# Patient Record
Sex: Female | Born: 1995 | Race: White | Hispanic: No | Marital: Single | Smoking: Never smoker
Health system: Southern US, Community
[De-identification: ages and names within clinical notes are randomized; demographics above are authoritative.]

## PROBLEM LIST (undated history)

## (undated) DIAGNOSIS — N83201 Unspecified ovarian cyst, right side: Secondary | ICD-10-CM

## (undated) DIAGNOSIS — E079 Disorder of thyroid, unspecified: Secondary | ICD-10-CM

## (undated) HISTORY — DX: Disorder of thyroid, unspecified: E07.9

## (undated) HISTORY — DX: Unspecified ovarian cyst, right side: N83.201

---

## 2016-03-06 ENCOUNTER — Emergency Department
Admission: EM | Admit: 2016-03-06 | Discharge: 2016-03-06 | Disposition: A | Discharge status: Home / Self Care | Payer: BLUE CROSS/BLUE SHIELD | Attending: Emergency Medicine | Admitting: Emergency Medicine

## 2016-03-06 ENCOUNTER — Emergency Department: Payer: BLUE CROSS/BLUE SHIELD

## 2016-03-06 ENCOUNTER — Encounter: Payer: Self-pay | Admitting: Medical Oncology

## 2016-03-06 DIAGNOSIS — R05 Cough: Secondary | ICD-10-CM | POA: Diagnosis present

## 2016-03-06 DIAGNOSIS — J322 Chronic ethmoidal sinusitis: Secondary | ICD-10-CM | POA: Insufficient documentation

## 2016-03-06 DIAGNOSIS — R059 Cough, unspecified: Secondary | ICD-10-CM

## 2016-03-06 MED ORDER — BENZONATATE 200 MG PO CAPS: 200.0000 mg | ORAL_CAPSULE | Freq: Three times a day (TID) | ORAL | 0 refills | Status: AC | PRN

## 2016-03-06 MED ORDER — FEXOFENADINE-PSEUDOEPHED ER 60-120 MG PO TB12: 1.0000 | ORAL_TABLET | Freq: Two times a day (BID) | ORAL | 0 refills | Status: AC

## 2016-03-06 NOTE — ED Provider Notes (Signed)
[facility] Emergency Department Provider Note   ____________________________________________   First MD Initiated Contact with Patient 03/06/16 1816     (approximate)  I have reviewed the triage vital signs and the nursing notes.   HISTORY  Chief Complaint Sore Throat; Generalized Body Aches; Cough; and Fever    HPI Dyesha Lawerance BachBurns is a 21 y.o. female patient complaining of sore throat and cough 2 weeks. Patient state that one week ago she developed fever and denies body aches. Patient states she had a negative flu test on that date. Patient continued to have cough. Denies nausea vomiting diarrhea. Patient has not taken a flu shot this season. Patient stated the cough intermitting productive and nonproductive. Patient also states there is some nasal congestion. Patient is using over-the-counter cough medicine with no relief.Patient rates her pain discomfort as 8/10. Patient is currently taking amoxicillin which was prescribed 5 days ago.   History reviewed. No pertinent past medical history.  There are no active problems to display for this patient.   No past surgical history on file.  Prior to Admission medications   Medication Sig Start Date End Date Taking? Authorizing Provider  amoxicillin (AMOXIL) 500 MG capsule Take 500 mg by mouth 3 (three) times daily.   Yes Historical Provider, MD  benzonatate (TESSALON) 200 MG capsule Take 1 capsule (200 mg total) by mouth 3 (three) times daily as needed for cough. 03/06/16   Joni Reiningonald K Smith, PA-C  fexofenadine-pseudoephedrine (ALLEGRA-D) 60-120 MG 12 hr tablet Take 1 tablet by mouth 2 (two) times daily. 03/06/16   Joni Reiningonald K Smith, PA-C    Allergies Guaifenesin & derivatives  No family history on file.  Social History Social History  Substance Use Topics  . Smoking status: Not on file  . Smokeless tobacco: Not on file  . Alcohol use Not on file    Review of Systems Constitutional: No fever/chills Eyes:  No visual changes. ENT: Sore throat and nasal congestion. Cardiovascular: Denies chest pain. Respiratory: Denies shortness of breath. Dr. cough. Gastrointestinal: No abdominal pain.  No nausea, no vomiting.  No diarrhea.  No constipation. Genitourinary: Negative for dysuria. Musculoskeletal: Negative for back pain. Skin: Negative for rash. Neurological: Negative for headaches, focal weakness or numbness.    ____________________________________________   PHYSICAL EXAM:  VITAL SIGNS: ED Triage Vitals  Enc Vitals Group     BP 03/06/16 1740 (!) 127/92     Pulse Rate 03/06/16 1739 99     Resp 03/06/16 1739 18     Temp 03/06/16 1739 97.9 F (36.6 C)     Temp Source 03/06/16 1739 Oral     SpO2 03/06/16 1739 98 %     Weight 03/06/16 1739 185 lb (83.9 kg)     Height 03/06/16 1739 5\' 5"  (1.651 m)     Head Circumference --      Peak Flow --      Pain Score 03/06/16 1739 8     Pain Loc --      Pain Edu? --      Excl. in GC? --     Constitutional: Alert and oriented. Well appearing and in no acute distress. Eyes: Conjunctivae are normal. PERRL. EOMI. Head: Atraumatic. Nose: Edematous nasal turbinates, thick nasal discharge and bilateral maxillary guarding. Mouth/Throat: Mucous membranes are moist.  Oropharynx non-erythematous. Postnasal drainage Neck: No stridor.  No cervical spine tenderness to palpation. Hematological/Lymphatic/Immunilogical: No cervical lymphadenopathy. Cardiovascular: Normal rate, regular rhythm. Grossly normal heart sounds.  Good peripheral circulation.  Respiratory: Normal respiratory effort.  No retractions. Lungs CTAB. Gastrointestinal: Soft and nontender. No distention. No abdominal bruits. No CVA tenderness. Musculoskeletal: No lower extremity tenderness nor edema.  No joint effusions. Neurologic:  Normal speech and language. No gross focal neurologic deficits are appreciated. No gait instability. Skin:  Skin is warm, dry and intact. No rash  noted. Psychiatric: Mood and affect are normal. Speech and behavior are normal.  ____________________________________________   LABS (all labs ordered are listed, but only abnormal results are displayed)  Labs Reviewed - No data to display ____________________________________________  EKG   ____________________________________________  RADIOLOGY  No acute findings on chest x-ray. ____________________________________________   PROCEDURES  Procedure(s) performed: None  Procedures  Critical Care performed: No  ____________________________________________   INITIAL IMPRESSION / ASSESSMENT AND PLAN / ED COURSE  Pertinent labs & imaging results that were available during my care of the patient were reviewed by me and considered in my medical decision making (see chart for details).  Maxillary sinusitis with postnasal drainage and cough. Patient advised to continue antibiotics as directed. Patient given a prescription for Allegra-D and Tessalon Perles. Patient advised to follow-up with family clinic if no improvement 3-5 days. Return by ER if condition worsens.  Clinical Course as of Mar 06 1900  Thu Mar 06, 2016  1837 DG Chest 2 View [RS]  1842 DG Chest 2 View [RS]  1843 DG Chest 2 View [RS]  1848 DG Chest 2 View [RS]  1849 DG Chest 2 View [RS]  1849 DG Chest 2 View [RS]  1849 DG Chest 2 View [RS]    Clinical Course User Index [RS] Valda Lamb, Student-PA     ____________________________________________   FINAL CLINICAL IMPRESSION(S) / ED DIAGNOSES  Final diagnoses:  Cough  Sinusitis chronic, ethmoidal      NEW MEDICATIONS STARTED DURING THIS VISIT:  New Prescriptions   BENZONATATE (TESSALON) 200 MG CAPSULE    Take 1 capsule (200 mg total) by mouth 3 (three) times daily as needed for cough.   FEXOFENADINE-PSEUDOEPHEDRINE (ALLEGRA-D) 60-120 MG 12 HR TABLET    Take 1 tablet by mouth 2 (two) times daily.     Note:  This document was prepared using  Dragon voice recognition software and may include unintentional dictation errors.    Joni Reining, PA-C 03/06/16 1902    Myrna Blazer, MD 03/06/16 2120

## 2016-03-06 NOTE — Discharge Instructions (Signed)
Continue antibiotics from previous prescriber.

## 2016-03-06 NOTE — ED Notes (Signed)
See triage note  States she developed sore throat and cough about 2 weeks ago  Last Thursday developed fever and body ache  Was seen at [facility] had the flu r/o  But conts to have cough   No fever at present

## 2016-03-06 NOTE — ED Triage Notes (Signed)
Pt reports flu like sx's that began over a week ago.

## 2017-01-20 HISTORY — PX: OVARIAN CYST REMOVAL: SHX89

## 2018-02-10 ENCOUNTER — Other Ambulatory Visit: Payer: Self-pay

## 2018-02-10 ENCOUNTER — Emergency Department
Admission: EM | Admit: 2018-02-10 | Discharge: 2018-02-10 | Disposition: A | Discharge status: Home / Self Care | Payer: BLUE CROSS/BLUE SHIELD | Attending: Student in an Organized Health Care Education/Training Program | Admitting: Student in an Organized Health Care Education/Training Program

## 2018-02-10 ENCOUNTER — Encounter: Payer: Self-pay | Admitting: *Deleted

## 2018-02-10 DIAGNOSIS — R197 Diarrhea, unspecified: Secondary | ICD-10-CM | POA: Insufficient documentation

## 2018-02-10 DIAGNOSIS — Z79899 Other long term (current) drug therapy: Secondary | ICD-10-CM | POA: Diagnosis not present

## 2018-02-10 DIAGNOSIS — R112 Nausea with vomiting, unspecified: Secondary | ICD-10-CM | POA: Insufficient documentation

## 2018-02-10 LAB — COMPREHENSIVE METABOLIC PANEL
ALK PHOS: 33 U/L — AB (ref 38–126)
ALT: 16 U/L (ref 0–44)
AST: 17 U/L (ref 15–41)
Albumin: 4.9 g/dL (ref 3.5–5.0)
Anion gap: 7 (ref 5–15)
BUN: 8 mg/dL (ref 6–20)
CALCIUM: 9.5 mg/dL (ref 8.9–10.3)
CO2: 23 mmol/L (ref 22–32)
Chloride: 110 mmol/L (ref 98–111)
Creatinine, Ser: 0.67 mg/dL (ref 0.44–1.00)
Glucose, Bld: 96 mg/dL (ref 70–99)
Potassium: 4 mmol/L (ref 3.5–5.1)
Sodium: 140 mmol/L (ref 135–145)
Total Bilirubin: 1 mg/dL (ref 0.3–1.2)
Total Protein: 7.8 g/dL (ref 6.5–8.1)

## 2018-02-10 LAB — URINALYSIS, COMPLETE (UACMP) WITH MICROSCOPIC
BILIRUBIN URINE: NEGATIVE
GLUCOSE, UA: NEGATIVE mg/dL
HGB URINE DIPSTICK: NEGATIVE
KETONES UR: NEGATIVE mg/dL
NITRITE: NEGATIVE
Protein, ur: NEGATIVE mg/dL
Specific Gravity, Urine: 1.018 (ref 1.005–1.030)
pH: 7 (ref 5.0–8.0)

## 2018-02-10 LAB — CBC
HCT: 41.4 % (ref 36.0–46.0)
Hemoglobin: 13.5 g/dL (ref 12.0–15.0)
MCH: 28.5 pg (ref 26.0–34.0)
MCHC: 32.6 g/dL (ref 30.0–36.0)
MCV: 87.5 fL (ref 80.0–100.0)
PLATELETS: 348 10*3/uL (ref 150–400)
RBC: 4.73 MIL/uL (ref 3.87–5.11)
RDW: 12.8 % (ref 11.5–15.5)
WBC: 4.5 10*3/uL (ref 4.0–10.5)
nRBC: 0 % (ref 0.0–0.2)

## 2018-02-10 LAB — LIPASE, BLOOD: Lipase: 28 U/L (ref 11–51)

## 2018-02-10 LAB — POC URINE PREG, ED: PREG TEST UR: NEGATIVE

## 2018-02-10 MED ORDER — METOCLOPRAMIDE HCL 5 MG/ML IJ SOLN
10.0000 mg | Freq: Once | INTRAMUSCULAR | Status: AC
Start: 2018-02-10 — End: 2018-02-10
  Administered 2018-02-10: 10 mg via INTRAVENOUS
  Filled 2018-02-10: qty 2

## 2018-02-10 MED ORDER — PROMETHAZINE HCL 25 MG/ML IJ SOLN
12.5000 mg | Freq: Four times a day (QID) | INTRAMUSCULAR | Status: DC | PRN
  Administered 2018-02-10: 12.5 mg via INTRAVENOUS
  Filled 2018-02-10: qty 1

## 2018-02-10 MED ORDER — METOCLOPRAMIDE HCL 10 MG PO TABS: 10.0000 mg | ORAL_TABLET | Freq: Four times a day (QID) | ORAL | 0 refills | Status: AC | PRN

## 2018-02-10 MED ORDER — SODIUM CHLORIDE 0.9 % IV BOLUS
1000.0000 mL | Freq: Once | INTRAVENOUS | Status: AC
  Administered 2018-02-10: 1000 mL via INTRAVENOUS

## 2018-02-10 NOTE — ED Triage Notes (Signed)
PT to ED reporting NVDS with dark stool and estimated 4 BMs a day. Generalized abd pain x 5 days. Pt unable to to keep food or drink down. Pt verbalized concern that she ate at a new restaurant and thought she could have had food poisoning but the pain has persisted. Pt unsure if she has had fevers but reports feeling clammy and chills with intermittent dizziness and fatigue. PT was given Zofran from [facility] center but reports it did not help.

## 2018-02-10 NOTE — ED Notes (Signed)
RN called to verify blood testing. Lab reports they are running the blood at this time.

## 2018-02-10 NOTE — ED Provider Notes (Signed)
[facility] Emergency Department Provider Note    First MD Initiated Contact with Patient 02/10/18 1414     (approximate)  I have reviewed the triage vital signs and the nursing notes.   HISTORY  Chief Complaint Abdominal Pain    HPI Anne Cobb is a 23 y.o. female presents the ER for evaluation of nausea vomiting and diarrhea that started over the weekend if the patient ate at [employer] restaurant in [place].  Is worried that she got food poisoning but the symptoms have not resolved.  Denies any focal pain.  States that she has noted a little bit of blood at the end of her vomiting.  Denies any chest pain or shortness of breath.  No bloody diarrhea.  States that she is having 3-4 loose stools per day and presented to the ER primarily because she felt dehydrated.  She [facility] yesterday and was given Zofran without improvement in her symptoms.  Denies any dysuria.  Did have ovarian cystectomy done over the summer but does not have any pain related to that.  History reviewed. No pertinent past medical history. History reviewed. No pertinent family history. History reviewed. No pertinent surgical history. There are no active problems to display for this patient.     Prior to Admission medications   Medication Sig Start Date End Date Taking? Authorizing Provider  amoxicillin (AMOXIL) 500 MG capsule Take 500 mg by mouth 3 (three) times daily.    [provider]  benzonatate (TESSALON) 200 MG capsule Take 1 capsule (200 mg total) by mouth 3 (three) times daily as needed for cough. 03/06/16   Joni ReiningSmith, Ronald K, PA-C  fexofenadine-pseudoephedrine (ALLEGRA-D) 60-120 MG 12 hr tablet Take 1 tablet by mouth 2 (two) times daily. 03/06/16   Joni ReiningSmith, Ronald K, PA-C  metoCLOPramide (REGLAN) 10 MG tablet Take 1 tablet (10 mg total) by mouth every 6 (six) hours as needed. 02/10/18 02/10/19  Willy Eddyobinson, Nikia Levels, MD    Allergies Guaifenesin &  derivatives    Social History Social History   Tobacco Use  . Smoking status: Never Smoker  . Smokeless tobacco: Never Used  Substance Use Topics  . Alcohol use: Never    Frequency: Never  . Drug use: Never    Review of Systems Patient denies headaches, rhinorrhea, blurry vision, numbness, shortness of breath, chest pain, edema, cough, abdominal pain, nausea, vomiting, diarrhea, dysuria, fevers, rashes or hallucinations unless otherwise stated above in HPI. ____________________________________________   PHYSICAL EXAM:  VITAL SIGNS: Vitals:   02/10/18 1129 02/10/18 1411  BP: (!) 141/83 132/79  Pulse: 83 83  Resp: 16 16  Temp: 98.5 F (36.9 C)   SpO2: 97% 100%    Constitutional: Alert and oriented. Well appearing and in no acute distress. Eyes: Conjunctivae are normal.  Head: Atraumatic. Nose: No congestion/rhinnorhea. Mouth/Throat: Mucous membranes are moist.   Neck: Painless ROM.  Cardiovascular:   Good peripheral circulation. Respiratory: Normal respiratory effort.  No retractions.  Gastrointestinal: Soft and nontender.  Musculoskeletal: No lower extremity tenderness .  No joint effusions. Neurologic:  Normal speech and language. No gross focal neurologic deficits are appreciated.  Skin:  Skin is warm, dry and intact. No rash noted. Psychiatric: Mood and affect are normal. Speech and behavior are normal.  ____________________________________________   LABS (all labs ordered are listed, but only abnormal results are displayed)  Results for orders placed or performed during the hospital encounter of 02/10/18 (from the past 24 hour(s))  Lipase, blood     Status: None   Collection Time: 02/10/18 11:35 AM  Result Value Ref Range   Lipase 28 11 - 51 U/L  Comprehensive metabolic panel     Status: Abnormal   Collection Time: 02/10/18 11:35 AM  Result Value Ref Range   Sodium 140 135 - 145 mmol/L   Potassium 4.0 3.5 - 5.1 mmol/L   Chloride 110 98 - 111 mmol/L    CO2 23 22 - 32 mmol/L   Glucose, Bld 96 70 - 99 mg/dL   BUN 8 6 - 20 mg/dL   Creatinine, Ser 8.460.67 0.44 - 1.00 mg/dL   Calcium 9.5 8.9 - 96.210.3 mg/dL   Total Protein 7.8 6.5 - 8.1 g/dL   Albumin 4.9 3.5 - 5.0 g/dL   AST 17 15 - 41 U/L   ALT 16 0 - 44 U/L   Alkaline Phosphatase 33 (L) 38 - 126 U/L   Total Bilirubin 1.0 0.3 - 1.2 mg/dL   GFR calc non Af Amer >60 >60 mL/min   GFR calc Af Amer >60 >60 mL/min   Anion gap 7 5 - 15  CBC     Status: None   Collection Time: 02/10/18 11:35 AM  Result Value Ref Range   WBC 4.5 4.0 - 10.5 K/uL   RBC 4.73 3.87 - 5.11 MIL/uL   Hemoglobin 13.5 12.0 - 15.0 g/dL   HCT 95.241.4 84.136.0 - 32.446.0 %   MCV 87.5 80.0 - 100.0 fL   MCH 28.5 26.0 - 34.0 pg   MCHC 32.6 30.0 - 36.0 g/dL   RDW 40.112.8 02.711.5 - 25.315.5 %   Platelets 348 150 - 400 K/uL   nRBC 0.0 0.0 - 0.2 %  Urinalysis, Complete w Microscopic     Status: Abnormal   Collection Time: 02/10/18 11:47 AM  Result Value Ref Range   Color, Urine YELLOW (A) YELLOW   APPearance CLEAR (A) CLEAR   Specific Gravity, Urine 1.018 1.005 - 1.030   pH 7.0 5.0 - 8.0   Glucose, UA NEGATIVE NEGATIVE mg/dL   Hgb urine dipstick NEGATIVE NEGATIVE   Bilirubin Urine NEGATIVE NEGATIVE   Ketones, ur NEGATIVE NEGATIVE mg/dL   Protein, ur NEGATIVE NEGATIVE mg/dL   Nitrite NEGATIVE NEGATIVE   Leukocytes, UA MODERATE (A) NEGATIVE   RBC / HPF 0-5 0 - 5 RBC/hpf   WBC, UA 11-20 0 - 5 WBC/hpf   Bacteria, UA RARE (A) NONE SEEN   Squamous Epithelial / LPF 6-10 0 - 5   Mucus PRESENT    Non Squamous Epithelial PRESENT (A) NONE SEEN  POC urine preg, ED     Status: None   Collection Time: 02/10/18 11:51 AM  Result Value Ref Range   Preg Test, Ur Negative Negative   ____________________________________________  ____________________________________________  RADIOLOGY   ____________________________________________   PROCEDURES  Procedure(s) performed:  Procedures    Critical Care performed:  no ____________________________________________   INITIAL IMPRESSION / ASSESSMENT AND PLAN / ED COURSE  Pertinent labs & imaging results that were available during my care of the patient were reviewed by me and considered in my medical decision making (see chart for details).  DDX: enteritis, gastritis, food borne illness, aki, eletrolyte abn, doubt appy, cholelithisiasis or cystitis  Anne Cobb is a 23 y.o. who presents to the ED with nausea vomiting symptoms as described above.  She currently afebrile hemodynamically stable in no acute distress.  No evidence of acute abdominal pain.  Exam is benign.  We  will give IV hydration and IV antiemetics.  Do suspect foodborne illness or viral process.  No report of any bloody diarrhea to suggest enterocolitis.  Clinical Course as of Feb 10 1626  Wed Feb 10, 2018  1628 Patient with improved symptoms after IV hydration and IV antiemetics.  States that she had improved symptoms after Reglan therefore will discharge home with that medication.  Discussed signs and symptoms for which she should return to the ER.   [PR]    Clinical Course User Index [PR] Willy Eddy, MD     ____________________________________________   FINAL CLINICAL IMPRESSION(S) / ED DIAGNOSES  Final diagnoses:  Non-intractable vomiting with nausea, unspecified vomiting type      NEW MEDICATIONS STARTED DURING THIS VISIT:  New Prescriptions   METOCLOPRAMIDE (REGLAN) 10 MG TABLET    Take 1 tablet (10 mg total) by mouth every 6 (six) hours as needed.     Note:  This document was prepared using Dragon voice recognition software and may include unintentional dictation errors.     Willy Eddy, MD 02/10/18 [phone]

## 2019-11-22 ENCOUNTER — Emergency Department
Admission: EM | Admit: 2019-11-22 | Discharge: 2019-11-22 | Disposition: A | Discharge status: Home / Self Care | Payer: BLUE CROSS/BLUE SHIELD | Attending: Student in an Organized Health Care Education/Training Program | Admitting: Student in an Organized Health Care Education/Training Program

## 2019-11-22 ENCOUNTER — Other Ambulatory Visit: Payer: Self-pay

## 2019-11-22 ENCOUNTER — Emergency Department: Payer: BLUE CROSS/BLUE SHIELD

## 2019-11-22 DIAGNOSIS — Z20822 Contact with and (suspected) exposure to covid-19: Secondary | ICD-10-CM | POA: Diagnosis not present

## 2019-11-22 DIAGNOSIS — R0981 Nasal congestion: Secondary | ICD-10-CM | POA: Insufficient documentation

## 2019-11-22 DIAGNOSIS — H9201 Otalgia, right ear: Secondary | ICD-10-CM | POA: Insufficient documentation

## 2019-11-22 DIAGNOSIS — R509 Fever, unspecified: Secondary | ICD-10-CM | POA: Diagnosis present

## 2019-11-22 DIAGNOSIS — R519 Headache, unspecified: Secondary | ICD-10-CM | POA: Insufficient documentation

## 2019-11-22 LAB — URINALYSIS, COMPLETE (UACMP) WITH MICROSCOPIC
Bacteria, UA: NONE SEEN
Bilirubin Urine: NEGATIVE
Glucose, UA: NEGATIVE mg/dL
Hgb urine dipstick: NEGATIVE
Ketones, ur: NEGATIVE mg/dL
Nitrite: NEGATIVE
Protein, ur: NEGATIVE mg/dL
Specific Gravity, Urine: 1.004 — ABNORMAL LOW (ref 1.005–1.030)
pH: 7 (ref 5.0–8.0)

## 2019-11-22 LAB — RESPIRATORY PANEL BY RT PCR (FLU A&B, COVID)
Influenza A by PCR: NEGATIVE
Influenza B by PCR: NEGATIVE
SARS Coronavirus 2 by RT PCR: NEGATIVE

## 2019-11-22 LAB — POC URINE PREG, ED: Preg Test, Ur: NEGATIVE

## 2019-11-22 MED ORDER — ACETAMINOPHEN 325 MG PO TABS
650.0000 mg | ORAL_TABLET | Freq: Once | ORAL | Status: AC
  Administered 2019-11-22: 650 mg via ORAL
  Filled 2019-11-22: qty 2

## 2019-11-22 MED ORDER — FEXOFENADINE-PSEUDOEPHED ER 60-120 MG PO TB12: 1.0000 | ORAL_TABLET | Freq: Two times a day (BID) | ORAL | 0 refills | Status: AC

## 2019-11-22 MED ORDER — AMOXICILLIN-POT CLAVULANATE 875-125 MG PO TABS: 1.0000 | ORAL_TABLET | Freq: Two times a day (BID) | ORAL | 0 refills | Status: AC

## 2019-11-22 NOTE — ED Provider Notes (Signed)
[facility] Emergency Department Provider Note   ____________________________________________   First MD Initiated Contact with Patient 11/22/19 [phone]     (approximate)  I have reviewed the triage vital signs and the nursing notes.   HISTORY  Chief Complaint Fever    HPI Anne Cobb is a 24 y.o. female patient presents with complaint of fever, frontal headache, chills, and right ear pain.  Patient denies recent travel or known contact with COVID-19.  Patient has completed vaccine for COVID-19.  Patient denies sore throat or loss of taste/smell.  Stay complaint for the past 7 to 10 days for worsening the last 2 days.  Rates the pain as 8/10.  Described pain as "pressure".  No palliative measure for complaint.         No past medical history on file.  There are no problems to display for this patient.   No past surgical history on file.  Prior to Admission medications   Medication Sig Start Date End Date Taking? Authorizing Provider  amoxicillin (AMOXIL) 500 MG capsule Take 500 mg by mouth 3 (three) times daily.    [provider]  amoxicillin-clavulanate (AUGMENTIN) 875-125 MG tablet Take 1 tablet by mouth 2 (two) times daily for 10 days. 11/22/19 12/02/19  Joni Reining, PA-C  benzonatate (TESSALON) 200 MG capsule Take 1 capsule (200 mg total) by mouth 3 (three) times daily as needed for cough. 03/06/16   Joni Reining, PA-C  fexofenadine-pseudoephedrine (ALLEGRA-D) 60-120 MG 12 hr tablet Take 1 tablet by mouth 2 (two) times daily. 03/06/16   Joni Reining, PA-C  fexofenadine-pseudoephedrine (ALLEGRA-D) 60-120 MG 12 hr tablet Take 1 tablet by mouth 2 (two) times daily. 11/22/19   Joni Reining, PA-C  metoCLOPramide (REGLAN) 10 MG tablet Take 1 tablet (10 mg total) by mouth every 6 (six) hours as needed. 02/10/18 02/10/19  Willy Eddy, MD    Allergies Guaifenesin & derivatives  No family history on file.  Social History Social  History   Tobacco Use  . Smoking status: Never Smoker  . Smokeless tobacco: Never Used  Substance Use Topics  . Alcohol use: Never  . Drug use: Never    Review of Systems Constitutional: No fever/chills Eyes: No visual changes. ENT: No sore throat.  Frontal sinus pain. Cardiovascular: Denies chest pain. Respiratory: Denies shortness of breath. Gastrointestinal: No abdominal pain.  No nausea, no vomiting.  No diarrhea.  No constipation. Genitourinary: Negative for dysuria. Musculoskeletal: Negative for back pain. Skin: Negative for rash. Neurological: Positive for headaches, but denies focal weakness or numbness. Allergic/Immunilogical: Guaifenesin ____________________________________________   PHYSICAL EXAM:  VITAL SIGNS: ED Triage Vitals [11/22/19 0126]  Enc Vitals Group     BP (!) 144/76     Pulse Rate (!) 118     Resp 20     Temp (!) 101.6 F (38.7 C)     Temp Source Oral     SpO2 97 %     Weight 170 lb (77.1 kg)     Height 5\' 5"  (1.651 m)     Head Circumference      Peak Flow      Pain Score 8     Pain Loc      Pain Edu?      Excl. in GC?    Constitutional: Alert and oriented. Well appearing and in no acute distress. Eyes: Conjunctivae are normal. PERRL. EOMI. Head: Atraumatic. Nose: No congestion/rhinnorhea.  Frontal sinus guarding. Mouth/Throat: Mucous membranes are  moist.  Oropharynx non-erythematous.  Postnasal drainage. Neck: No stridor.  No cervical spine tenderness to palpation Hematological/Lymphatic/Immunilogical: No cervical lymphadenopathy. Cardiovascular: Normal rate, regular rhythm. Grossly normal heart sounds.  Good peripheral circulation. Respiratory: Normal respiratory effort.  No retractions. Lungs CTAB. Genitourinary: Deferred Musculoskeletal: No lower extremity tenderness nor edema.  No joint effusions. Neurologic:  Normal speech and language. No gross focal neurologic deficits are appreciated. No gait instability. Skin:  Skin is warm,  dry and intact. No rash noted. Psychiatric: Mood and affect are normal. Speech and behavior are normal.  ____________________________________________   LABS (all labs ordered are listed, but only abnormal results are displayed)  Labs Reviewed  URINALYSIS, COMPLETE (UACMP) WITH MICROSCOPIC - Abnormal; Notable for the following components:      Result Value   Color, Urine STRAW (*)    APPearance CLEAR (*)    Specific Gravity, Urine 1.004 (*)    Leukocytes,Ua SMALL (*)    All other components within normal limits  RESPIRATORY PANEL BY RT PCR (FLU A&B, COVID)  POC URINE PREG, ED   ____________________________________________  EKG   ____________________________________________  RADIOLOGY I, Joni Reining, personally viewed and evaluated these images (plain radiographs) as part of my medical decision making, as well as reviewing the written report by the radiologist.  ED MD interpretation: No acute findings on chest x-ray. Official radiology report(s): DG Chest 2 View  Result Date: 11/22/2019 CLINICAL DATA:  Fever EXAM: CHEST - 2 VIEW COMPARISON:  03/06/2016 FINDINGS: The heart size and mediastinal contours are within normal limits. Both lungs are clear. The visualized skeletal structures are unremarkable. IMPRESSION: Normal study. Electronically Signed   By: Charlett Nose M.D.   On: 11/22/2019 02:00    ____________________________________________   PROCEDURES  Procedure(s) performed (including Critical Care):  Procedures   ____________________________________________   INITIAL IMPRESSION / ASSESSMENT AND PLAN / ED COURSE  As part of my medical decision making, I reviewed the following data within the electronic MEDICAL RECORD NUMBER         Patient presents with sinus congestion, headache, complaint of fever, and earache.  Discussed negative COVID-19 and influenza results with patient.  Patient checks x-ray was unremarkable.  Patient complaint and physical exam  consistent with sinusitis.  Patient given discharge care instructions and a prescription for Allegra-D and Augmentin.  Advised to follow-up with PCP.      ____________________________________________   FINAL CLINICAL IMPRESSION(S) / ED DIAGNOSES  Final diagnoses:  Sinus congestion     ED Discharge Orders         Ordered    fexofenadine-pseudoephedrine (ALLEGRA-D) 60-120 MG 12 hr tablet  2 times daily        11/22/19 0729    amoxicillin-clavulanate (AUGMENTIN) 875-125 MG tablet  2 times daily        11/22/19 6967          *Please note:  Anne Cobb was evaluated in Emergency Department on 11/22/2019 for the symptoms described in the history of present illness. She was evaluated in the context of the global COVID-19 pandemic, which necessitated consideration that the patient might be at risk for infection with the SARS-CoV-2 virus that causes COVID-19. Institutional protocols and algorithms that pertain to the evaluation of patients at risk for COVID-19 are in a state of rapid change based on information released by regulatory bodies including the CDC and federal and state organizations. These policies and algorithms were followed during the patient's care in the ED.  Some ED evaluations and  interventions may be delayed as a result of limited staffing during and the pandemic.*   Note:  This document was prepared using Dragon voice recognition software and may include unintentional dictation errors.    Joni Reining, PA-C 11/22/19 1941    Willy Eddy, MD 11/22/19 [phone]

## 2019-11-22 NOTE — Discharge Instructions (Signed)
Follow discharge care instructions take medication as directed. °

## 2019-11-22 NOTE — ED Notes (Signed)
Pt returns to lobby to inquire over wait time; vs retaken and pt updated

## 2019-11-22 NOTE — ED Triage Notes (Signed)
Pt in with co fever x 2 days, chills, also co headache.  Pt co right earache states does have a hx of sinus infections.

## 2019-11-22 NOTE — ED Notes (Signed)
No answer when called from lobby for vs 

## 2021-02-05 ENCOUNTER — Other Ambulatory Visit: Payer: Self-pay

## 2021-02-05 ENCOUNTER — Ambulatory Visit: Payer: BLUE CROSS/BLUE SHIELD | Attending: Obstetrics and Gynecology | Admitting: Physical Therapy

## 2021-02-05 ENCOUNTER — Encounter: Payer: Self-pay | Admitting: Physical Therapy

## 2021-02-05 DIAGNOSIS — M62838 Other muscle spasm: Secondary | ICD-10-CM | POA: Diagnosis present

## 2021-02-05 DIAGNOSIS — R2689 Other abnormalities of gait and mobility: Secondary | ICD-10-CM | POA: Insufficient documentation

## 2021-02-05 DIAGNOSIS — R293 Abnormal posture: Secondary | ICD-10-CM | POA: Diagnosis present

## 2021-02-05 DIAGNOSIS — M533 Sacrococcygeal disorders, not elsewhere classified: Secondary | ICD-10-CM | POA: Diagnosis not present

## 2021-02-05 NOTE — Patient Instructions (Signed)
°  Avoid straining pelvic floor, abdominal muscles , spine  Use log rolling technique instead of getting out of bed with your neck or the sit-up     Log rolling into and out of bed   Log rolling into and out of bed If getting out of bed on R side, Bent knees, scoot hips/ shoulder to L  Raise R arm completely overhead, rolling onto armpit  Then lower bent knees to bed to get into complete side lying position  Then drop legs off bed, and push up onto R elbow/forearm, and use L hand to push onto the bed   __  Sitting with feet on the ground, not crossed ankles

## 2021-02-06 NOTE — Therapy (Signed)
[facility] [facility] MAIN [facility] SERVICES [address] [place], [place], [address] Phone: [phone]   Fax:  [phone]  Physical Therapy Evaluation  Patient Details  Name: Anne Cobb MRN: [id number] Date of Birth: 13-Aug-1995 Referring Provider (PT): Dalbert Garnet MD   Encounter Date: 02/05/2021   PT End of Session - 02/05/21 1444     Visit Number 1    Number of Visits 10    Date for PT Re-Evaluation 04/16/21    PT Start Time 1403    PT Stop Time 1500    PT Time Calculation (min) 57 min    Activity Tolerance Patient tolerated treatment well    Behavior During Therapy [facility] for tasks assessed/performed             Past Medical History:  Diagnosis Date   Bilateral ovarian cysts    surgery to remove 13 inch R benign cyst 2019 , currently pt has cysts on R and L without pain   Thyroid disease    Pt was told she had 2-3 nodules when she was 26 years old, pt does not take medication, pt is under monitoring    Past Surgical History:  Procedure Laterality Date   OVARIAN CYST REMOVAL Right 2019    There were no vitals filed for this visit.    Subjective Assessment - 02/05/21 1443     Subjective  Pelvic Pain:  Pt started a new job in August 2022 and it was negative affecting her overall health, physically and mentally. Her grandma passed away during that time. Pt is no longer working that job which was a toxic environment. Pt worked long hours and had no time to [employer]. During this time, pt noticed pelvic pain inside her vagina that was pulsing and tight all the time. Pt noticed she was in a state of stress. Within 3 weeks of ending her job, she no longer that the constant pulsing pain. Pt still feels "tearing" pain with sexual intercourse 6-7/10  and the transvaginal US and the pain lingers . Pt feels discomfort with speculum during gynecology exam.Hx of irregular periods ( every 2 weeks) when she was in college, heavy periods and bad cramps. Then  she was on birth control for one year after. Pt had  side-effects to the pill and discontinued the pill.  Pt is taking norethindrone-ethinyl estradiol 1/35 (ORTHO-NOVUM) tablet for the past 8  months and it is shrinking her cysts.  Currently with cysts on B ovaries. One 13 inch benign ovarian cyst on R was removed in 2019. Denied ovarian pain. Denied no falls onto tailbone, LBP. Hx of twisted ankle 5 x on LLE. Regular BMs, and no pain with urination.   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 Pt notices she did not pee and nor have a BM at school. She trained herself to hold her pee.    Pt's stress management strategies: pt used to go to therapy but not in the past 2 years because she had negative experience her last therapist. Pt wants to find a new one.  Relaxation practices:Pt also like to care for her dog and go to the gym for stress relief. Pt does stretches, and used to do yoga classes. Pt does weight machines, treadmill. Pt did not do sit ups as her workout.    Patient Stated Goals relax more during sexual intercourse and pelvic exams.  [facility] PT Assessment - 02/06/21 4401       Assessment   Medical Diagnosis pelvic pain    Referring Provider (PT) Dalbert Garnet MD      Precautions   Precautions None      Restrictions   Weight Bearing Restrictions No      Balance Screen   Has the patient fallen in the past 6 months No      Observation/Other Assessments   Observations ankles crossed      Coordination   Coordination and Movement Description ab mm overuse , chest breathing      Posture/Postural Control   Posture Comments hyperextended /adducted knees      Strength   Overall Strength Comments BLE 4/5 , hip abd 4-/5 B      Palpation   Spinal mobility tightnsess at throacic spine    Palpation comment tenderness at low abdomen suprapubic area                        Objective measurements completed on examination: See above findings.      Pelvic Floor Special Questions - 02/06/21 0923     Diastasis Recti below umbilicus    External Perineal Exam through clothing, dyscoordination                            PT Long Term Goals - 02/06/21 0818       PT LONG TERM GOAL #1   Title Pt will decrease FOTO score for PElvic Pain from 17 pts to < 15 pts in order to improve QOL    Time 10    Period Weeks    Status New    Target Date 04/17/21      PT LONG TERM GOAL #2   Title Pt will demo proper deep core coordination with less ab overuse in order to promote pelvic floor function    Time 2    Period Weeks    Status New    Target Date 02/20/21      PT LONG TERM GOAL #3   Title Pt will demo proper body mechanics to mionimize straining ab and pelvic floor for longer lasting outcomes with pelvic floor function    Time 4    Status New    Target Date 03/06/21      PT LONG TERM GOAL #4   Title Pt will demo proper alignment and technique with fitness exercises, weight machines, and coordination of depe core to minimize overuse of ab mm over pelvic floor mm to minimize pelvic pain    Time 8    Period Weeks    Status New    Target Date 04/03/21      PT LONG TERM GOAL #5   Title Pt will demo more pelvic floor upward mobility and lengthening and relaxation tehcniques to minimzie overactivity of pelvic floor    Time 6    Period Weeks    Status New    Target Date 03/20/21      Additional Long Term Goals   Additional Long Term Goals Yes      PT LONG TERM GOAL #6   Title Pt will demo less adducted and hyperextended knees in standing posture and fitness exercises in order to minimize tight pelvic floor mm    Time 8    Period Weeks    Status New    Target Date 04/03/21  PT LONG TERM GOAL #7   Title Pt will complete bladder diary in order to help 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    Time 4    Period Weeks    Status New    Target Date 03/06/21                     Plan - 02/05/21 1444     Clinical Impression Statement  Pt is a  26  yo  who presents with pelvic pain and experiences pain with sexual intercourse, with transvaginal US exams and experiences discomfort with speculum during gynecology exam.  Pt's musculoskeletal assessment revealed dyscoordination of deep core mm, weakness at low abdomen, lower kinetic chain deficits ( hyperextended and adducted knees), and poor body mechanics which places strain on the abdominal/pelvic floor mm.   These are deficits that indicate an ineffective intraabdominal pressure system associated with increased risk for pt's Sx.   Behavorial habits and medical Hx will be considered into her POC: Hx of ovarian cysts, works as a Lawyersubstitute teacher and postpones urge of urination/ bowel movement, and Hx of stressful experiences when pelvic pain started.   Pt will benefit from proper coordination training and education on fitness and functional positions because pt enjoys working out in the gym. Regional interdependent approaches will yield greater benefits in pt's POC. Pt would also  benefit from a biopsychosocial approach to yield optimal outcomes. Plan to build interdisciplinary team with pt's providers to optimize patient-centered care. Referred pt to a psychotherapist.   Following Tx today which pt tolerated without complaints, pt demo'd proper body  mechanics to minimzie straining abdominal/ pelvic floor mm. Plan to discuss bladder diary and water intake at next session. Pt benefits from skilled PT.     Examination-Activity Limitations Caring for Others    Stability/Clinical Decision Making Evolving/Moderate complexity    Clinical Decision Making Moderate    Rehab Potential Good    PT Frequency 1x / week    PT Duration Other (comment)   10   PT Treatment/Interventions ADLs/Self Care Home Management;Moist Heat;Therapeutic activities;Therapeutic exercise;Balance training;Neuromuscular re-education;Gait  training;Functional mobility training;Stair training;Patient/family education;Manual techniques;Splinting;Energy conservation;Scar mobilization;Spinal Manipulations;Joint Manipulations;Dry needling    Consulted and Agree with Plan of Care Patient             Patient will benefit from skilled therapeutic intervention in order to improve the following deficits and impairments:  Hypermobility, Decreased endurance, Decreased balance, Decreased mobility, Decreased strength, Impaired sensation, Postural dysfunction, Improper body mechanics, Decreased scar mobility, Decreased safety awareness, Abnormal gait, Decreased coordination, Increased fascial restricitons, Increased muscle spasms, Pain, Decreased skin integrity, Hypomobility, Impaired flexibility  Visit Diagnosis:  Other muscle spasm  Other abnormalities of gait and mobility  Abnormal posture     Problem List There are no problems to display for this patient.   Mariane MastersYeung,Shin Yiing, PT 02/06/2021, 9:31 AM  [facility] MAIN [facility] SERVICES [address] [place], [place], [address] Phone: [phone]   Fax:  [phone]  Name: Anne BoastRaquel Cobb MRN: [id number] Date of Birth: 04-Aug-1995

## 2021-02-12 ENCOUNTER — Ambulatory Visit: Payer: BLUE CROSS/BLUE SHIELD | Admitting: Physical Therapy

## 2021-02-12 ENCOUNTER — Other Ambulatory Visit: Payer: Self-pay

## 2021-02-12 DIAGNOSIS — R2689 Other abnormalities of gait and mobility: Secondary | ICD-10-CM

## 2021-02-12 DIAGNOSIS — R293 Abnormal posture: Secondary | ICD-10-CM

## 2021-02-12 DIAGNOSIS — M62838 Other muscle spasm: Secondary | ICD-10-CM | POA: Diagnosis not present

## 2021-02-12 NOTE — Patient Instructions (Signed)
°  Lengthen Back rib by R  shoulder    Lie on L  side , pillow between knees and under head  Pull  arm overhead over mattress, grab the edge of mattress,pull it upward, drawing elbow away from ears  Breathing 10 reps both sides   Open book  Lying on  _ side , rotating  __ only this week  Rotating onto pillow /yoga block 3/4 turn, chest, then chin tuck and rotate Pillow/ Block between knees  10 reps both sides

## 2021-02-13 NOTE — Therapy (Signed)
[place] [facility] MAIN [facility] SERVICES [address] [place], [place], [address] Phone: [phone]   Fax:  [phone]  Physical Therapy Treatment  Patient Details  Name: Anne Cobb MRN: [id number] Date of Birth: 1995/04/26 Referring Provider (PT): Dalbert Garnet MD   Encounter Date: 02/12/2021   PT End of Session - 02/12/21 1714     Visit Number 2    Number of Visits 10    Date for PT Re-Evaluation 04/16/21    PT Start Time 1710    PT Stop Time 1810    PT Time Calculation (min) 60 min    Activity Tolerance Patient tolerated treatment well    Behavior During Therapy [facility] for tasks assessed/performed             Past Medical History:  Diagnosis Date   Bilateral ovarian cysts    surgery to remove 13 inch R benign cyst 2019 , currently pt has cysts on R and L without pain   Thyroid disease    Pt was told she had 2-3 nodules when she was 26 years old, pt does not take medication, pt is under monitoring    Past Surgical History:  Procedure Laterality Date   OVARIAN CYST REMOVAL Right 2019    There were no vitals filed for this visit.   Subjective Assessment - 02/12/21 1714     Subjective Pt reported she practiced log rolling to get out of bed. Pt reports she has tight mm by shoulders    Patient Stated Goals relax more during sexual intercourse and pelvic exams.                [facility] PT Assessment - 02/13/21 0807       Palpation   Spinal mobility limited rotation B with tightness at T/L junction    Palpation comment tightness at upper trap/levator scapula/ SCM/ medial scapula/ inferior  attachments B                           [facility] Adult PT Treatment/Exercise - 02/13/21 0809       Neuro Re-ed    Neuro Re-ed Details  cued for neck/ back stretches for upright posture, relaxation practices with breathing . Excessive cues for less upper trap overuse and more segmental movement of spine from thoracic to cervical      Modalities   Modalities Moist Heat      Moist Heat Therapy   Number Minutes Moist Heat 5 Minutes    Moist Heat Location --   provided restorative yoga with props and guided relaxation with explanation of principles     Manual Therapy   Manual therapy comments STM/MWM at problem areas noted in assessment to promote less forward head and upright posture                          PT Long Term Goals - 02/06/21 0818       PT LONG TERM GOAL #1   Title Pt will decrease FOTO score for PElvic Pain from 17 pts to < 15 pts in order to improve QOL    Time 10    Period Weeks    Status New    Target Date 04/17/21      PT LONG TERM GOAL #2   Title Pt will demo proper deep core coordination with less ab overuse in order to promote pelvic floor function  Time 2    Period Weeks    Status New    Target Date 02/20/21      PT LONG TERM GOAL #3   Title Pt will demo proper body mechanics to mionimize straining ab and pelvic floor for longer lasting outcomes with pelvic floor function    Time 4    Status New    Target Date 03/06/21      PT LONG TERM GOAL #4   Title Pt will demo proper alignment and technique with fitness exercises, weight machines, and coordination of depe core to minimize overuse of ab mm over pelvic floor mm to minimize pelvic pain    Time 8    Period Weeks    Status New    Target Date 04/03/21      PT LONG TERM GOAL #5   Title Pt will demo more pelvic floor upward mobility and lengthening and relaxation tehcniques to minimzie overactivity of pelvic floor    Time 6    Period Weeks    Status New    Target Date 03/20/21      Additional Long Term Goals   Additional Long Term Goals Yes      PT LONG TERM GOAL #6   Title Pt will demo less adducted and hyperextended knees in standing posture and fitness exercises in order to minimize tight pelvic floor mm    Time 8    Period Weeks    Status New    Target Date 04/03/21      PT LONG TERM GOAL #7   Title  Pt will complete bladder diary in order 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    Time 4    Period Weeks    Status New    Target Date 03/06/21                   Plan - 02/12/21 1714     Clinical Impression Statement Pt required manual Tx to minimize thoracic and cervical mm tightness and to optimize diaphragmatic excursion. Cued pt for neck/ back stretches,  relaxation practices with breathing . Excessive cues were needed for less upper trap overuse and more segmental movement of spine from thoracic to cervical for upright posture. Pt demo'd improved deep core coordination post Tx but will add deep core into HEP at next session. Focused on mobility in HEP for this session to ensure more relaxation as pt reported anxiety and stress in her Hx. Plan to educate pain science next session. Pt continues to benefit from skilled PT.   Examination-Activity Limitations Caring for Others    Stability/Clinical Decision Making Evolving/Moderate complexity    Rehab Potential Good    PT Frequency 1x / week    PT Duration Other (comment)   10   PT Treatment/Interventions ADLs/Self Care Home Management;Moist Heat;Therapeutic activities;Therapeutic exercise;Balance training;Neuromuscular re-education;Gait training;Functional mobility training;Stair training;Patient/family education;Manual techniques;Splinting;Energy conservation;Scar mobilization;Spinal Manipulations;Joint Manipulations;Dry needling    Consulted and Agree with Plan of Care Patient             Patient will benefit from skilled therapeutic intervention in order to improve the following deficits and impairments:  Hypermobility, Decreased endurance, Decreased balance, Decreased mobility, Decreased strength, Impaired sensation, Postural dysfunction, Improper body mechanics, Decreased scar mobility, Decreased safety awareness, Abnormal gait, Decreased coordination, Increased fascial restricitons, Increased muscle  spasms, Pain, Decreased skin integrity, Hypomobility, Impaired flexibility  Visit Diagnosis: Abnormal posture  Other abnormalities of gait and mobility  Other muscle spasm     Problem List There are no problems to display for this patient.   Jerl Mina, PT 02/13/2021, 8:19 AM  [place] MAIN [facility] SERVICES [address] [place], [place], [address] Phone: [phone]   Fax:  [phone]  Name: Anne Cobb MRN: [id number] Date of Birth: 10-Oct-1995

## 2021-02-19 ENCOUNTER — Other Ambulatory Visit: Payer: Self-pay

## 2021-02-19 ENCOUNTER — Encounter: Payer: Self-pay | Admitting: Physical Therapy

## 2021-02-19 ENCOUNTER — Ambulatory Visit: Payer: BLUE CROSS/BLUE SHIELD | Admitting: Physical Therapy

## 2021-02-19 DIAGNOSIS — R293 Abnormal posture: Secondary | ICD-10-CM

## 2021-02-19 DIAGNOSIS — M62838 Other muscle spasm: Secondary | ICD-10-CM

## 2021-02-19 DIAGNOSIS — R2689 Other abnormalities of gait and mobility: Secondary | ICD-10-CM

## 2021-02-19 NOTE — Patient Instructions (Signed)
° °  ____  Neck / shoulder stretches:    Lying on back - small sushi roll towel under neck  _ 6 directions  5 reps  _elbow circles in and out to lower shoulder blades down and back  10 reps  _angel wings, lower elbows down , keep arms touching bed  10 reps    At work:  _Doorway squat : bear stretch for shoulder blades _wall stretch Clock stretch with head turns :  stand perpendicular to the wall, L side to the wall Tilt head to wall  Place L palm at 7 o clock Chin tuck like you are looking into armpit Look at "[place] on giant map  Swivel head with chin tucked to look in upper corner of ceiling as if you are look at  [place] on giant map "   5 reps   Switch direction, R palm on wall at 5 o 'clock   Chin tuck like you are looking into armpit Look at "[place]  on giant map  Swivel head with chin tucked to look in upper corner of ceiling as if you are look at  [place] on giant map "   5 reps    ___ 

## 2021-02-19 NOTE — Therapy (Signed)
[facility] MAIN [facility] SERVICES [address] [place], [place], [address] Phone: [phone]   Fax:  [phone]  Physical Therapy Treatment  Patient Details  Name: Anne Cobb MRN: [id number] Date of Birth: Nov 10, 1995 Referring Provider (PT): Dalbert Garnet MD   Encounter Date: 02/20/2023   PT End of Session - 02/19/21 1857     Visit Number 3    Number of Visits 10    Date for PT Re-Evaluation 04/16/21    PT Start Time 1604    PT Stop Time 1704    PT Time Calculation (min) 60 min    Activity Tolerance Patient tolerated treatment well    Behavior During Therapy [facility] for tasks assessed/performed             Past Medical History:  Diagnosis Date   Bilateral ovarian cysts    surgery to remove 13 inch R benign cyst 2019 , currently pt has cysts on R and L without pain   Thyroid disease    Pt was told she had 2-3 nodules when she was 26 years old, pt does not take medication, pt is under monitoring    Past Surgical History:  Procedure Laterality Date   OVARIAN CYST REMOVAL Right 2019    There were no vitals filed for this visit.   Subjective Assessment - 02/19/21 1610     Subjective Pt reported she felt really good for a few days after last session. Pt did not have time to do her HEP due to a stressful week.    Patient Stated Goals relax more during sexual intercourse and pelvic exams.                [facility] PT Assessment - 02/19/21 1654       AROM   Overall AROM Comments increased throacic mobility, sideflexion/ rotation      Palpation   Spinal mobility hypomobility at T3-4, T7-10                           [facility] Adult PT Treatment/Exercise - 02/19/21 1654       Neuro Re-ed    Neuro Re-ed Details  cued for cervical / scapular retraction/ mobility      Modalities   Modalities Moist Heat      Moist Heat Therapy   Number Minutes Moist Heat 10 Minutes    Moist Heat Location --   cervical/ back, legs  propped up, guided relaxation for 5 min, other 5 min of heat unbilled     Manual Therapy   Manual therapy comments STM/MWM/ at problem areas noted in assessment to promote less forward head and upright posture                          PT Long Term Goals - 02/06/21 0818       PT LONG TERM GOAL #1   Title Pt will decrease FOTO score for PElvic Pain from 17 pts to < 15 pts in order to improve QOL    Time 10    Period Weeks    Status New    Target Date 04/17/21      PT LONG TERM GOAL #2   Title Pt will demo proper deep core coordination with less ab overuse in order to promote pelvic floor function    Time 2    Period Weeks    Status New  Target Date 02/20/21      PT LONG TERM GOAL #3   Title Pt will demo proper body mechanics to mionimize straining ab and pelvic floor for longer lasting outcomes with pelvic floor function    Time 4    Status New    Target Date 03/06/21      PT LONG TERM GOAL #4   Title Pt will demo proper alignment and technique with fitness exercises, weight machines, and coordination of depe core to minimize overuse of ab mm over pelvic floor mm to minimize pelvic pain    Time 8    Period Weeks    Status New    Target Date 04/03/21      PT LONG TERM GOAL #5   Title Pt will demo more pelvic floor upward mobility and lengthening and relaxation tehcniques to minimzie overactivity of pelvic floor    Time 6    Period Weeks    Status New    Target Date 03/20/21      Additional Long Term Goals   Additional Long Term Goals Yes      PT LONG TERM GOAL #6   Title Pt will demo less adducted and hyperextended knees in standing posture and fitness exercises in order to minimize tight pelvic floor mm    Time 8    Period Weeks    Status New    Target Date 04/03/21      PT LONG TERM GOAL #7   Title Pt will complete bladder diary in order to 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    Time 4    Period Weeks    Status New     Target Date 03/06/21                   Plan - 02/19/21 1859     Clinical Impression Statement Pt showed decreased hypomobility at thoracic region but still required more manual Tx to minimize forward head posture and decrease upper trap overuse. Pt will be ready for deep core training with these improvements made today. Pt continues to  benefit from skilled PT.    Examination-Activity Limitations Caring for Others    Stability/Clinical Decision Making Evolving/Moderate complexity    Rehab Potential Good    PT Frequency 1x / week    PT Duration Other (comment)   10   PT Treatment/Interventions ADLs/Self Care Home Management;Moist Heat;Therapeutic activities;Therapeutic exercise;Balance training;Neuromuscular re-education;Gait training;Functional mobility training;Stair training;Patient/family education;Manual techniques;Splinting;Energy conservation;Scar mobilization;Spinal Manipulations;Joint Manipulations;Dry needling    Consulted and Agree with Plan of Care Patient             Patient will benefit from skilled therapeutic intervention in order to improve the following deficits and impairments:  Hypermobility, Decreased endurance, Decreased balance, Decreased mobility, Decreased strength, Impaired sensation, Postural dysfunction, Improper body mechanics, Decreased scar mobility, Decreased safety awareness, Abnormal gait, Decreased coordination, Increased fascial restricitons, Increased muscle spasms, Pain, Decreased skin integrity, Hypomobility, Impaired flexibility  Visit Diagnosis: Other abnormalities of gait and mobility  Other muscle spasm  Abnormal posture     Problem List There are no problems to display for this patient.   Mariane Masters, PT 02/19/2021, 7:01 PM  [place] [facility] MAIN [facility] SERVICES [address] [place], [place], [address] Phone: [phone]   Fax:  [phone]  Name: Lakitha Gordy MRN: [id number] Date  of Birth: 1995-04-03

## 2021-02-26 ENCOUNTER — Encounter: Payer: Self-pay | Admitting: Physical Therapy

## 2021-02-27 ENCOUNTER — Ambulatory Visit: Payer: BC Managed Care – PPO | Admitting: Physical Therapy

## 2021-03-12 ENCOUNTER — Other Ambulatory Visit: Payer: Self-pay

## 2021-03-12 ENCOUNTER — Ambulatory Visit: Payer: BC Managed Care – PPO | Attending: Obstetrics and Gynecology | Admitting: Physical Therapy

## 2021-03-12 DIAGNOSIS — R293 Abnormal posture: Secondary | ICD-10-CM | POA: Insufficient documentation

## 2021-03-12 DIAGNOSIS — R2689 Other abnormalities of gait and mobility: Secondary | ICD-10-CM | POA: Diagnosis present

## 2021-03-12 DIAGNOSIS — M62838 Other muscle spasm: Secondary | ICD-10-CM | POA: Insufficient documentation

## 2021-03-12 NOTE — Therapy (Signed)
[facility] MAIN [facility] SERVICES [address] [place], [place], [address] Phone: [phone]   Fax:  [phone]  Physical Therapy Treatment  Patient Details  Name: Anne Cobb MRN: [id number] Date of Birth: 21-Oct-1995 Referring Provider (PT): Dalbert Garnet MD   Encounter Date: 03/12/2021   PT End of Session - 03/12/21 1708     Visit Number 4    Number of Visits 10    Date for PT Re-Evaluation 04/16/21    PT Start Time 1700    PT Stop Time 1800    PT Time Calculation (min) 60 min    Activity Tolerance Patient tolerated treatment well    Behavior During Therapy [facility] for tasks assessed/performed             Past Medical History:  Diagnosis Date   Bilateral ovarian cysts    surgery to remove 13 inch R benign cyst 2019 , currently pt has cysts on R and L without pain   Thyroid disease    Pt was told she had 2-3 nodules when she was 26 years old, pt does not take medication, pt is under monitoring    Past Surgical History:  Procedure Laterality Date   OVARIAN CYST REMOVAL Right 2019    There were no vitals filed for this visit.   Subjective Assessment - 03/12/21 1705     Subjective Pt reported she feels she slept on her neck wrong and it hurts.  Pt saw her gynecologist and found out her cyst has grown larger and will require surgery. Pt is having difficulty scheduling because she will be moving in May.    Pertinent History Hx of irregular periods ( every 2 weeks) when she was in college, heavy periods and bad cramps. Then she was on birth control for one year after. Pt had  side-effects to the pill and discontinued the pill.  Pt is taking norethindrone-ethinyl estradiol 1/35 (ORTHO-NOVUM) tablet for the past 8  months and it is shrinking her cysts.  Currently with cysts on B ovaries. One 13 inch benign ovarian cyst on R was removed in 2019. Denied ovarian pain. Denied no falls onto tailbone, LBP.    Patient Stated Goals relax more during  sexual intercourse and pelvic exams.                               [facility] Adult PT Treatment/Exercise - 03/12/21 1813       Therapeutic Activites    Other Therapeutic Activities active listening, explained and read the reports of gynecologist notes on the status of her cyst and her recommendations      Neuro Re-ed    Neuro Re-ed Details  cued for pelvic tilts, pelvic floor stretches      Modalities   Modalities Moist Heat      Moist Heat Therapy   Number Minutes Moist Heat 5 Minutes    Moist Heat Location --   perineum through towel/ sheets , butterfly pose supported     Manual Therapy   Manual therapy comments STM/MWM/ at problem areas noted in assessment to promote anterior pelvic floor lengthening ( external technique)                          PT Long Term Goals - 03/12/21 1707       PT LONG TERM GOAL #1   Title Pt will decrease FOTO  score for PElvic Pain from 17 pts to < 15 pts in order to improve QOL    Time 10    Period Weeks    Status On-going    Target Date 04/17/21      PT LONG TERM GOAL #2   Title Pt will demo proper deep core coordination with less ab overuse in order to promote pelvic floor function    Time 2    Period Weeks    Status On-going    Target Date 02/20/21      PT LONG TERM GOAL #3   Title Pt will demo proper body mechanics to mionimize straining ab and pelvic floor for longer lasting outcomes with pelvic floor function    Time 4    Status On-going    Target Date 03/06/21      PT LONG TERM GOAL #4   Title Pt will demo proper alignment and technique with fitness exercises, weight machines, and coordination of depe core to minimize overuse of ab mm over pelvic floor mm to minimize pelvic pain    Time 8    Period Weeks    Status On-going    Target Date 04/03/21      PT LONG TERM GOAL #5   Title Pt will demo more pelvic floor upward mobility and lengthening and relaxation tehcniques to minimzie overactivity  of pelvic floor    Time 6    Period Weeks    Status On-going    Target Date 03/20/21      PT LONG TERM GOAL #6   Title Pt will demo less adducted and hyperextended knees in standing posture and fitness exercises in order to minimize tight pelvic floor mm    Time 8    Period Weeks    Status On-going    Target Date 04/03/21      PT LONG TERM GOAL #7   Title Pt will complete bladder diary in order to 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    Time 4    Period Weeks    Status On-going    Target Date 03/06/21                   Plan - 03/12/21 1758     Clinical Impression Statement Pt demo'd decreased pelvic floor mm tightness at anterior region after external Tx. Pt will be ready for deep core HEP next session. Pt required cues for less hyperextended knees to minimize overactivity of pelvic floor. Pt continues to benefit from skilled PT   Examination-Activity Limitations Caring for Others    Stability/Clinical Decision Making Evolving/Moderate complexity    Rehab Potential Good    PT Frequency 1x / week    PT Duration Other (comment)   10   PT Treatment/Interventions ADLs/Self Care Home Management;Moist Heat;Therapeutic activities;Therapeutic exercise;Balance training;Neuromuscular re-education;Gait training;Functional mobility training;Stair training;Patient/family education;Manual techniques;Splinting;Energy conservation;Scar mobilization;Spinal Manipulations;Joint Manipulations;Dry needling    Consulted and Agree with Plan of Care Patient             Patient will benefit from skilled therapeutic intervention in order to improve the following deficits and impairments:  Hypermobility, Decreased endurance, Decreased balance, Decreased mobility, Decreased strength, Impaired sensation, Postural dysfunction, Improper body mechanics, Decreased scar mobility, Decreased safety awareness, Abnormal gait, Decreased coordination, Increased fascial restricitons,  Increased muscle spasms, Pain, Decreased skin integrity, Hypomobility, Impaired flexibility  Visit Diagnosis: Other abnormalities of gait and mobility  Other muscle spasm  Abnormal posture  Problem List There are no problems to display for this patient.   Mariane Masters, PT 03/12/2021, 6:15 PM  [facility] MAIN [facility] SERVICES [address] [place], [place], [address] Phone: [phone]   Fax:  [phone]  Name: Anne Cobb MRN: [id number] Date of Birth: Jun 14, 1995

## 2021-03-12 NOTE — Patient Instructions (Addendum)
Stretch for pelvic floor   V- slides  "v heels slide away and then back toward buttocks and then rock knee to slight ,  slide heel along at 11 o clock away from buttocks   10 reps    Happy Baby pose with towel     Mermaid stretch  Rocking while seated on the floor with heels to one side of the hip Heels to one side of the hip  Rock forward towards the knee that is bent , rock beck towards the opposite sitting bones  ___  Not locking knees when standing , soft knees bending  More weight on ballmounds

## 2021-03-19 ENCOUNTER — Ambulatory Visit: Payer: BC Managed Care – PPO | Admitting: Physical Therapy

## 2021-03-26 ENCOUNTER — Other Ambulatory Visit: Payer: Self-pay

## 2021-03-26 ENCOUNTER — Ambulatory Visit: Payer: BLUE CROSS/BLUE SHIELD | Attending: Obstetrics and Gynecology | Admitting: Physical Therapy

## 2021-03-26 DIAGNOSIS — R2689 Other abnormalities of gait and mobility: Secondary | ICD-10-CM | POA: Insufficient documentation

## 2021-03-26 DIAGNOSIS — R293 Abnormal posture: Secondary | ICD-10-CM | POA: Insufficient documentation

## 2021-03-26 DIAGNOSIS — M62838 Other muscle spasm: Secondary | ICD-10-CM | POA: Insufficient documentation

## 2021-03-26 NOTE — Therapy (Signed)
[facility] [facility] ?[place], [place], [address] ?Phone: [phone]   Fax:  [phone] ? ?Patient Details  ?Name: Anne Cobb ?MRN: [id number] ?Date of Birth: 05-Sep-1995 ?Referring Provider:  Benjaman Kindler, MD ? ?Encounter Date: 03/26/2021 ? ?Discharge Summary  ? ?Pt reported she noticed painful cramps since Nov-Dec. Pain still persists with walking a few steps and with some of the exercises from PT.  Pt completed 4 sessions and demo'd improved posture, less forward posture, and improved diaphragmatic excursion. However , pt was not able to perform deep core level 2 exercise which is performed in hooklying with small ROM of hip abduction/ER nor the happy baby yoga pose while on her back with deep hip flexion/ hip ER/abd, due to pain.  Pt was advised to discontinue these exercises.  Therapist communicated with referring provider re: pt's pain levels despite trying pelvic PT and staff will be contacting pt to get worked into MD's schedule. Discharging pt for further medical workup.  ? ? PT Long Term Goals - 03/26/21 1717   ? ?  ? PT LONG TERM GOAL #1  ? Title Pt will decrease FOTO score for PElvic Pain from 17 pts to < 15 pts in order to improve QOL             ( 03/26/21: score worsened , 42 pts)   ? Time 10   ? Period Weeks   ? Status Not Met    ? Target Date 04/17/21   ?  ? PT LONG TERM GOAL #2  ? Title Pt will demo proper deep core coordination with less ab overuse in order to promote pelvic floor function  ( 03/26/21: deep core level 1 does not cause pain, Level 2 does cause pain)   ? Time 2   ? Period Weeks   ? Status Deferred   ? Target Date 02/20/21   ?  ? PT LONG TERM GOAL #3  ? Title Pt will demo proper body mechanics to minimize straining ab and pelvic floor for longer lasting outcomes with pelvic floor function   ? Time 4   ? Status Achieved   ? Target Date 03/06/21   ?  ? PT LONG TERM GOAL #4  ? Title Pt will demo proper alignment and  technique with fitness exercises, weight machines, and coordination of deep core to minimize overuse of ab mm over pelvic floor mm to minimize pelvic pain   ? Time 8   ? Period Weeks   ? Status Deferred   ? Target Date 04/03/21   ?  ? PT LONG TERM GOAL #5  ? Title Pt will demo more pelvic floor upward mobility and lengthening and relaxation tehcniques to minimzie overactivity of pelvic floor   ? Time 6   ? Period Weeks   ? Status Deferred   ? Target Date 03/20/21   ?  ? PT LONG TERM GOAL #6  ? Title Pt will demo less adducted and hyperextended knees in standing posture and fitness exercises in order to minimize tight pelvic floor mm   ? Time 8   ? Period Weeks   ? Status Partially met   ? Target Date 04/03/21   ?  ? PT LONG TERM GOAL #7  ? 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   ? Time 4   ? Period Weeks   ? Status  Deferred   ? Target Date 03/06/21   ? ?  ?  ? ?  ? ? ? ?Jerl Mina, PT ?03/26/2021, 5:32 PM ? ?[place] [facility] ?[place], [place], [address] ?Phone: [phone]   Fax:  [phone] ?

## 2021-04-02 ENCOUNTER — Ambulatory Visit: Payer: BLUE CROSS/BLUE SHIELD | Admitting: Physical Therapy

## 2021-04-11 ENCOUNTER — Encounter: Payer: Self-pay | Admitting: Physical Therapy

## 2021-04-12 ENCOUNTER — Other Ambulatory Visit
Admission: RE | Admit: 2021-04-12 | Discharge: 2021-04-12 | Disposition: A | Discharge status: Home / Self Care | Payer: BLUE CROSS/BLUE SHIELD | Source: Ambulatory Visit | Attending: Obstetrics and Gynecology | Admitting: Obstetrics and Gynecology

## 2021-04-12 DIAGNOSIS — R0602 Shortness of breath: Secondary | ICD-10-CM | POA: Insufficient documentation

## 2021-04-12 LAB — D-DIMER, QUANTITATIVE: D-Dimer, Quant: 0.4 ug/mL-FEU (ref 0.00–0.50)

## 2021-10-04 IMAGING — CR DG CHEST 2V
2 series · 2 of 2 positions shown · non-contrast
Comparison: 03/06/2016

CLINICAL DATA: Fever

EXAM:
CHEST - 2 VIEW

[chest pa]
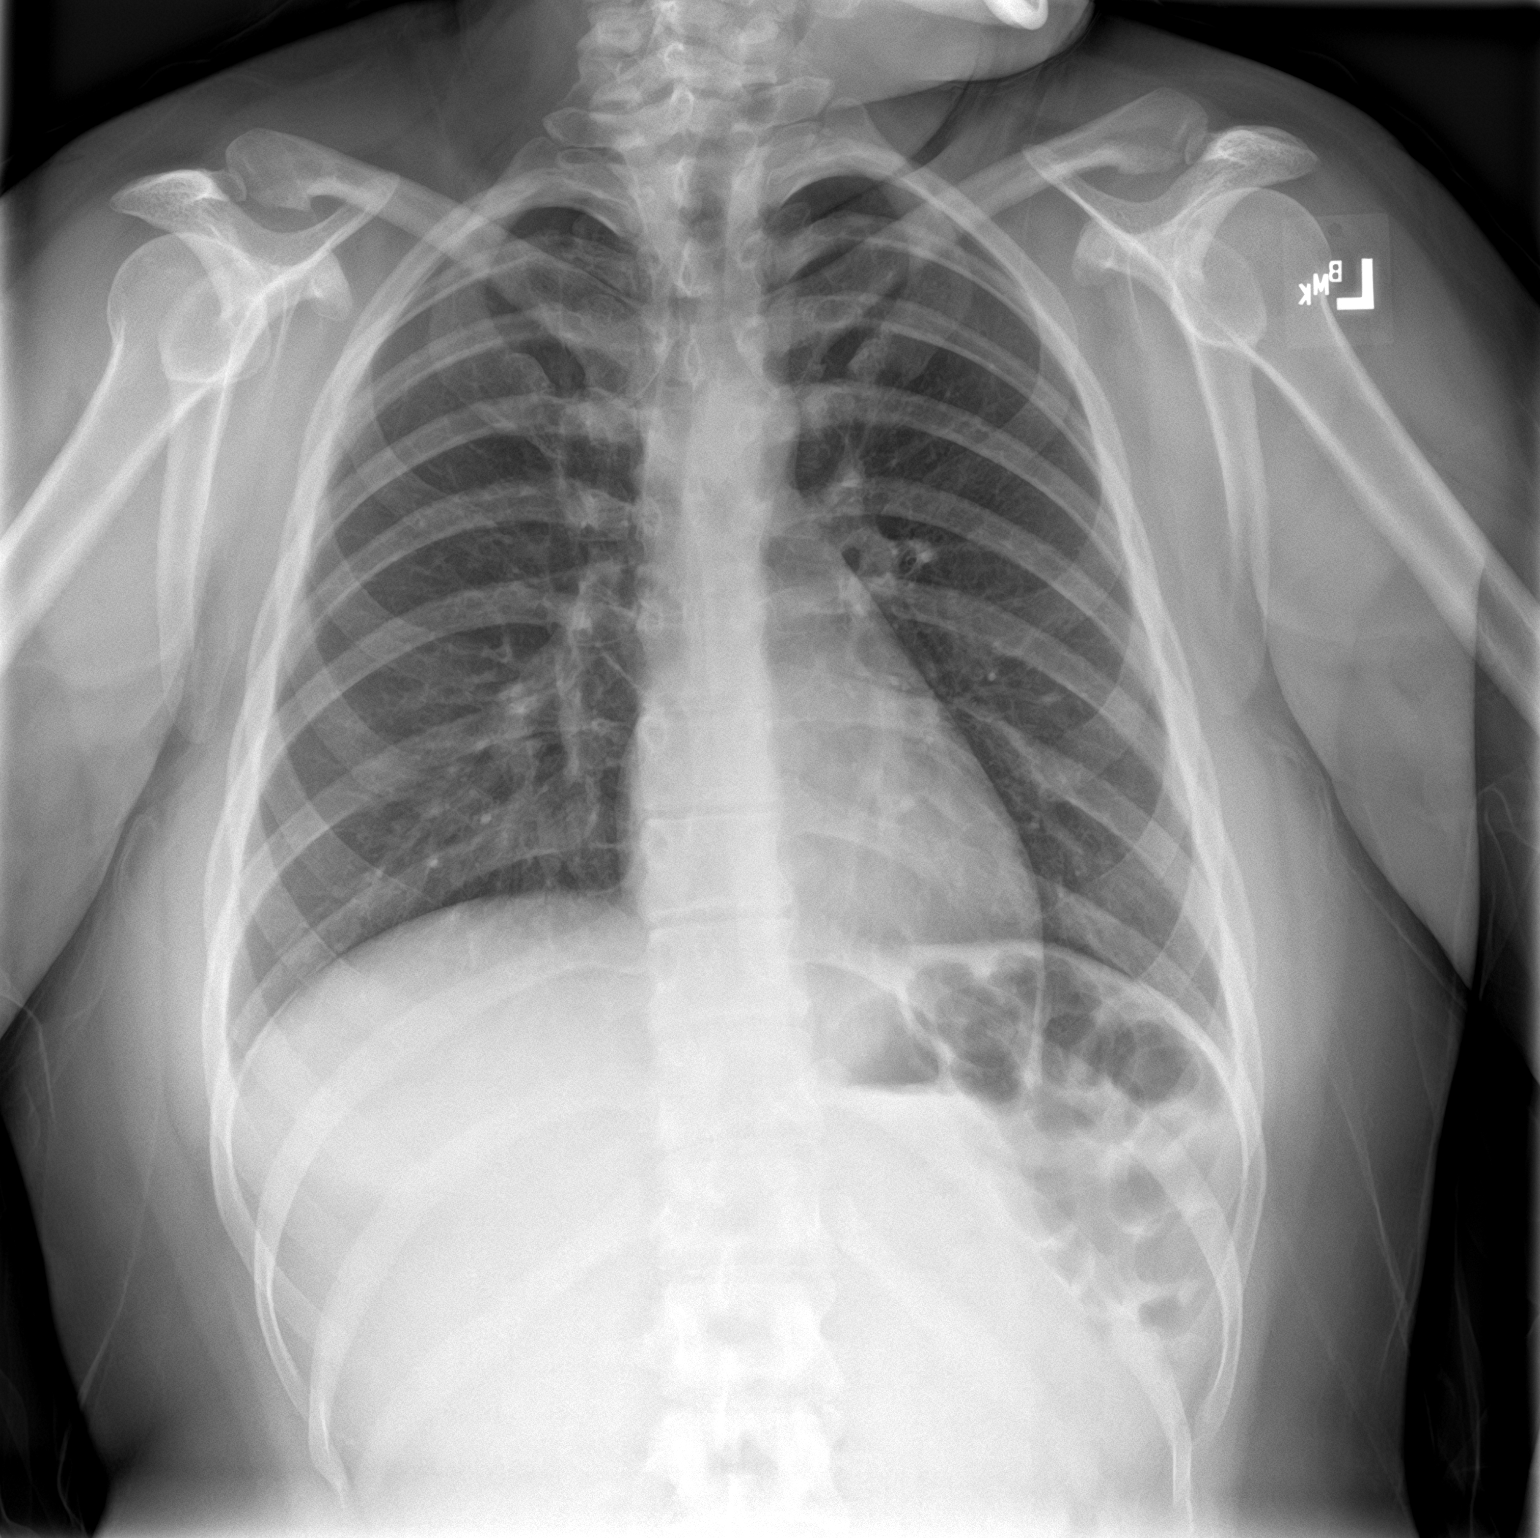

[chest lat]
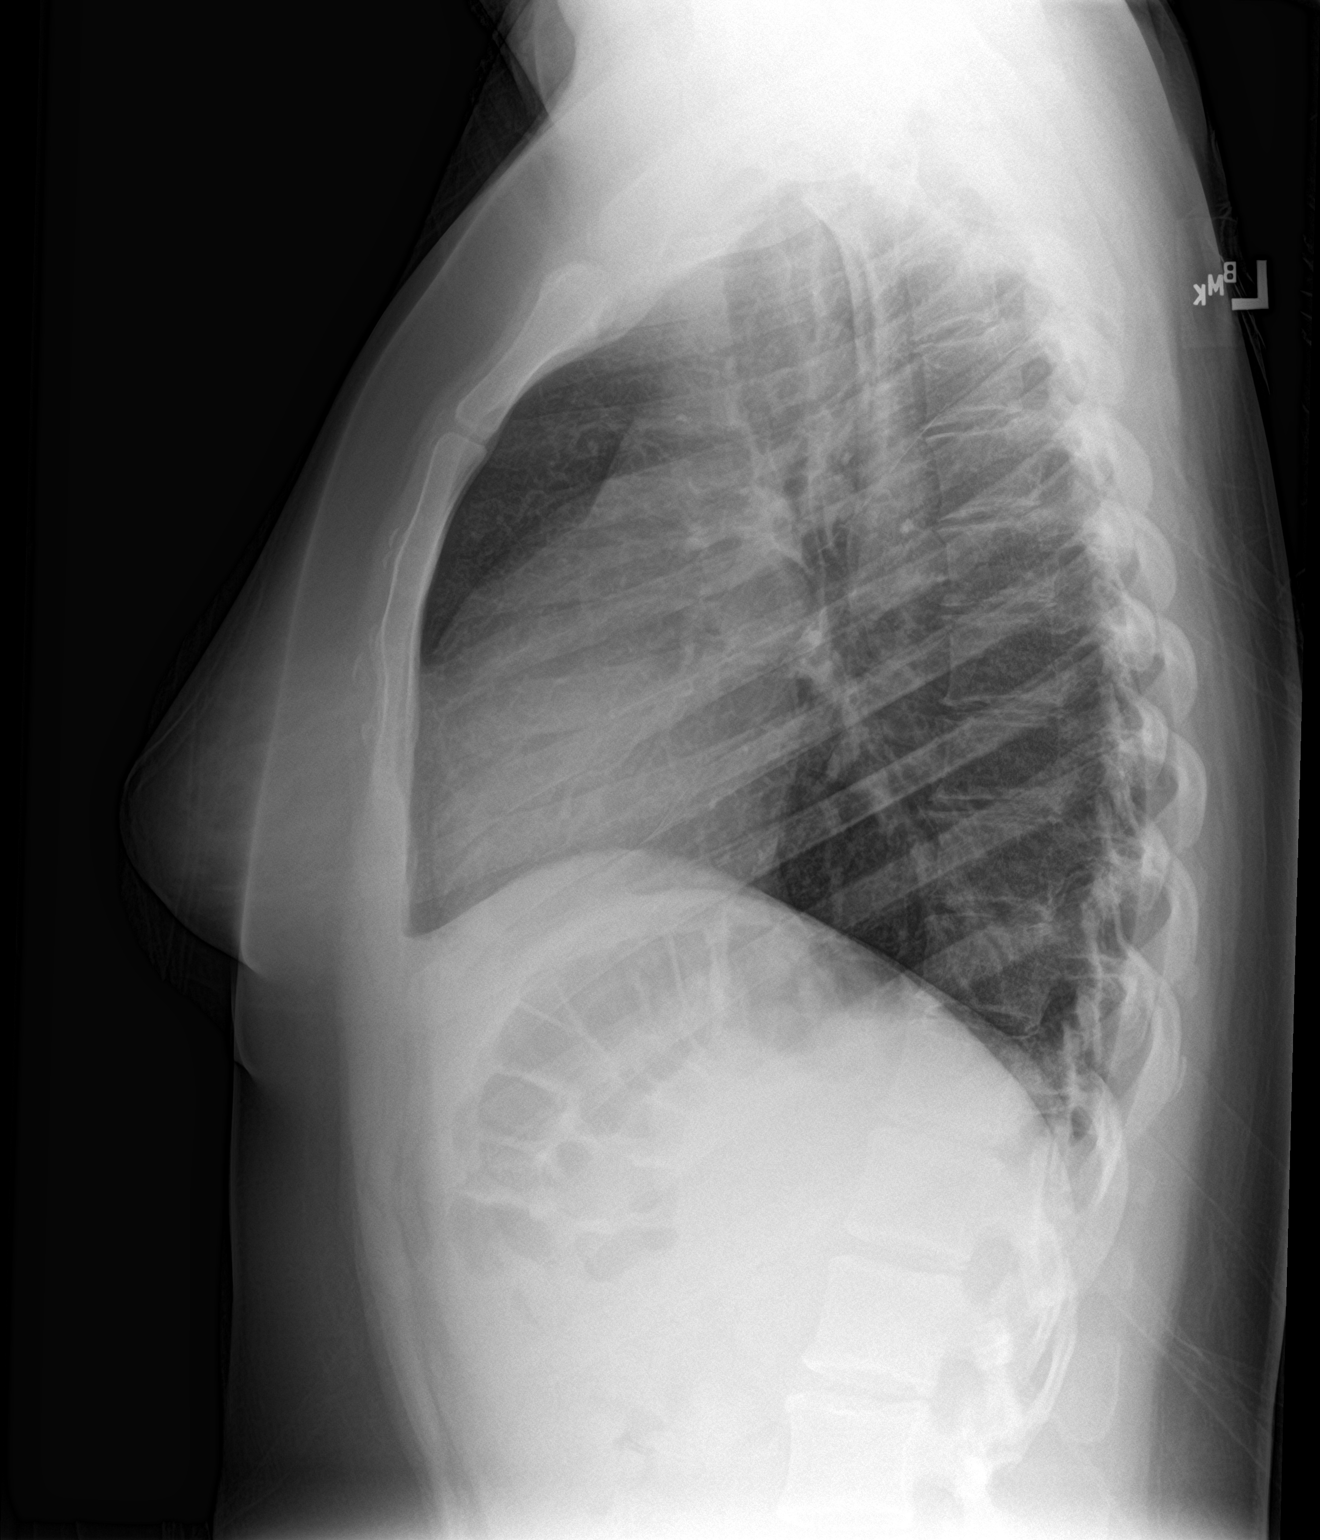

[2 of 2 positions shown; findings below may reference images not displayed]

FINDINGS: The heart size and mediastinal contours are within normal limits.
Both lungs are clear. The visualized skeletal structures are
unremarkable.
IMPRESSION: Normal study.
